# Patient Record
Sex: Female | Born: 1935 | Race: White | Hispanic: No | Marital: Married | Smoking: Never smoker
Health system: Southern US, Community
[De-identification: ages and names within clinical notes are randomized; demographics above are authoritative.]

---

## 2015-06-13 ENCOUNTER — Ambulatory Visit (INDEPENDENT_AMBULATORY_CARE_PROVIDER_SITE_OTHER): Payer: Commercial Managed Care - PPO

## 2015-06-13 ENCOUNTER — Ambulatory Visit (INDEPENDENT_AMBULATORY_CARE_PROVIDER_SITE_OTHER): Payer: Commercial Managed Care - PPO | Admitting: Physician Assistant

## 2015-06-13 VITALS — BP 122/62 | HR 59 | Temp 98.5°F | Ht 65.5 in | Wt 187.0 lb

## 2015-06-13 DIAGNOSIS — M25532 Pain in left wrist: Secondary | ICD-10-CM | POA: Diagnosis not present

## 2015-06-13 DIAGNOSIS — S5292XA Unspecified fracture of left forearm, initial encounter for closed fracture: Secondary | ICD-10-CM

## 2015-06-13 DIAGNOSIS — S6992XA Unspecified injury of left wrist, hand and finger(s), initial encounter: Secondary | ICD-10-CM

## 2015-06-13 MED ORDER — HYDROCODONE-ACETAMINOPHEN 5-325 MG PO TABS: 1.0000 | ORAL_TABLET | Freq: Three times a day (TID) | ORAL | Status: AC | PRN

## 2015-06-13 NOTE — Patient Instructions (Addendum)
IF you received an x-ray today, you will receive an invoice from [facility]. Please contact [facility] at [phone] with questions or concerns regarding your invoice.   IF you received labwork today, you will receive an invoice from [facility]. Please contact [facility] at [phone] with questions or concerns regarding your invoice.   Our billing staff will not be able to assist you with questions regarding bills from these companies.  You will be contacted with the lab results as soon as they are available. The fastest way to get your results is to activate your My Chart account. Instructions are located on the last page of this paperwork. If you have not heard from us regarding the results in 2 weeks, please contact this office.   Please await contact for your appointment with the orthopedist.  I will try to get a doctor in [place].  Please bring your CD to the visit.  You can use the norco for pain.  Keep this splinted.   Radial Fracture A radial fracture is a break in the radius bone, which is the long bone of the forearm that is on the same side as your thumb. Your forearm is the part of your arm that is between your elbow and your wrist. It is made up of two bones: the radius and the ulna. Most radial fractures occur near the wrist (distal radialfracture) or near the elbow (radial head fracture). A distal radial fracture is the most common type of broken arm. This fracture usually occurs about an inch above the wrist. Fractures of the middle part of the bone are less common. CAUSES  Falling with your arm outstretched is the most common cause of a radial fracture. Other causes include:  Car accidents.  Bike accidents.  A direct blow to the middle part of the radius. RISK FACTORS  You may be at greater risk for a distal radial fracture if you are 80 years of age or older.  You may be at greater risk for a radial head fracture if  you are:  Female.  2630-80 years old.  You may be at a greater risk for all types of radial fractures if you have a condition that causes your bones to be weak or thin (osteoporosis). SIGNS AND SYMPTOMS A radial fracture causes pain immediately after the injury. Other signs and symptoms include:  An abnormal bend or bump in your arm (deformity).  Swelling.  Bruising.  Numbness or tingling.  Tenderness.  Limited movement. DIAGNOSIS  Your health care provider may diagnose a radial fracture based on:  Your symptoms.  Your medical history, including any recent injury.  A physical exam. Your health care provider will look for any deformity and feel for tenderness over the break. Your health care provider will also check whether the bone is out of place.  An X-ray exam to confirm the diagnosis and learn more about the type of fracture. TREATMENT The goals of treatment are to get the bone in proper position for healing and to keep it from moving so it will heal over time. Your treatment will depend on many factors, especially the type of fracture that you have.  If the fractured bone:  Is in the correct position (nondisplaced), you may only need to wear a cast or a splint.  Has a slightly displaced fracture, you may need to have the bones moved back into place manually (closed reduction) before the splint or cast is put on.  You may have a temporary splint before you have a plaster cast. The splint allows room for some swelling. After a few days, a cast can replace the splint.  You may have to wear the cast for about 6 weeks or as directed by your health care provider.  The cast may be changed after about 3 weeks or as directed by your health care provider.  After your cast is taken off, you may need physical therapy to regain full movement in your wrist or elbow.  You may need emergency surgery if you have:  A fractured bone that is out of position (displaced).  A fracture  with multiple fragments (comminuted fracture).  A fracture that breaks the skin (open fracture). This type of fracture may require surgical wires, plates, or screws to hold the bone in place.  You may have X-rays every couple of weeks to check on your healing. HOME CARE INSTRUCTIONS  Keep the injured arm above the level of your heart while you are sitting or lying down. This helps to reduce swelling and pain.  Apply ice to the injured area:  Put ice in a plastic bag.  Place a towel between your skin and the bag.  Leave the ice on for 20 minutes, 2-3 times per day.  Move your fingers often to avoid stiffness and to minimize swelling.  If you have a plaster or fiberglass cast:  Do not try to scratch the skin under the cast using sharp or pointed objects.  Check the skin around the cast every day. You may put lotion on any red or sore areas.  Keep your cast dry and clean.  If you have a plaster splint:  Wear the splint as directed.  Loosen the elastic around the splint if your fingers become numb and tingle, or if they turn cold and blue.  Do not put pressure on any part of your cast until it is fully hardened. Rest your cast only on a pillow for the first 24 hours.  Protect your cast or splint while bathing or showering, as directed by your health care provider. Do not put your cast or splint into water.  Take medicines only as directed by your health care provider.  Return to activities, such as sports, as directed by your health care provider. Ask your health care provider what activities are safe for you.  Keep all follow-up visits as directed by your health care provider. This is important. SEEK MEDICAL CARE IF:  Your pain medicine is not helping.  Your cast gets damaged or it breaks.  Your cast becomes loose.  Your cast gets wet.  You have more severe pain or swelling than you did before the cast.  You have severe pain when stretching your fingers.  You  continue to have pain or stiffness in your elbow or your wrist after your cast is taken off. SEEK IMMEDIATE MEDICAL CARE IF:  You cannot move your fingers.  You lose feeling in your fingers or your hand.  Your hand or your fingers turn cold and pale or blue.  You notice a bad smell coming from your cast.  You have drainage from underneath your cast.  You have new stains from blood or drainage seeping through your cast.   This information is not intended to replace advice given to you by your health care provider. Make sure you discuss any questions you have with your health care provider.   Document Released: 08/05/2005 Document Revised: 03/15/2014 Document Reviewed:  08/17/2013 Elsevier Interactive Patient Education [employer]

## 2015-06-18 ENCOUNTER — Other Ambulatory Visit: Payer: Self-pay | Admitting: Physician Assistant

## 2015-06-18 DIAGNOSIS — S5292XA Unspecified fracture of left forearm, initial encounter for closed fracture: Secondary | ICD-10-CM

## 2015-06-18 NOTE — Progress Notes (Signed)
[facility] [address], [place] [id number] [phone]  Date:  06/13/2015   Name:  Ashley PatchJosephine Hayden   DOB:  [birth year]   MRN:  [id number]  PCP:  PROVIDER NOT IN SYSTEM    History of Present Illness:  Ashley Hayden is a 82 y.o. female patient who [facility] wrist pain.  Patient was in bathroom where floor was wet.  She slipped and hit her left hand on the tile on the wall.  Immediate pain and burning through to the digits insued.  It has then began to swell and pain worsening.  Sensation is intact.  She can not move the arm.  She has attempted to keep ice on the wrist.  She can not recall a prior injury to this wrist.       There are no active problems to display for this patient.   No past medical history on file.  No past surgical history on file.  Social History  Substance Use Topics  . Smoking status: Never Smoker   . Smokeless tobacco: Never Used  . Alcohol Use: None    No family history on file.  No Known Allergies  Medication list has been reviewed and updated.  No current outpatient prescriptions on file prior to visit.   No current facility-administered medications on file prior to visit.    ROS ROS otherwise unremarkable unless listed above.   Physical Examination: BP 122/62 mmHg  Pulse 59  Temp(Src) 98.5 F (36.9 C) (Oral)  Ht 5' 5.5" (1.664 m)  Wt 187 lb (84.823 kg)  BMI 30.63 kg/m2  SpO2 96% Ideal Body Weight: Weight in (lb) to have BMI = 25: 152.2  Physical Exam  Constitutional: She is oriented to person, place, and time. She appears well-developed and well-nourished. No distress.  HENT:  Head: Normocephalic and atraumatic.  Right Ear: External ear normal.  Left Ear: External ear normal.  Eyes: Conjunctivae and EOM are normal. Pupils are equal, round, and reactive to light.  Cardiovascular: Normal rate.   Pulmonary/Chest: Effort normal. No respiratory distress.  Musculoskeletal:       Left  wrist: She exhibits decreased range of motion, tenderness (tender along the lateral side of wrist.  pain with radial deviation.  decreased ranage of motion with radial deviation  as well as in all planes.  swelling and ecchymosis along the lateral wrist extending into the thumb.  there is point tenderness at mcp) and swelling.  Normal capillary refill.  Swelling throughout the and to digits.    Neurological: She is alert and oriented to person, place, and time.  Skin: She is not diaphoretic.  Psychiatric: She has a normal mood and affect. Her behavior is normal.   Dg Wrist 2 Views Left  06/13/2015  CLINICAL DATA:  Left wrist pain and swelling. EXAM: LEFT WRIST - 2 VIEW COMPARISON:  None FINDINGS: Diffuse soft tissue swelling present. There may be subtle fracture along the dorsal aspect of the distal radius. Only two views were obtained centered more over the distal forearm. A dedicated 3 view wrist series may be helpful for further delineation. There are degenerative changes involving the radiocarpal joint and first and second CMC joints. IMPRESSION: Soft tissue swelling and possible subtle fracture of the distal radius along its dorsal surface. A dedicated three view wrist series centered more over the wrist may be helpful. Electronically Signed   By: Irish LackGlenn  Yamagata M.D.   On: 06/13/2015 19:07  Dg Hand 2 View Left  06/13/2015  CLINICAL DATA:  Swelling and pain of left wrist.  Injury. EXAM: LEFT HAND - 2 VIEW COMPARISON:  Left wrist films on same date. FINDINGS: As noted on the wrist films, subtle fracture involving the dorsal aspect of the distal radius is suspected. There are diffuse degenerative changes of the hand and carpal bones. Soft tissue swelling is present centered over the wrist. IMPRESSION: Again noted is possible subtle fracture involving the dorsal aspect of the distal radius. Electronically Signed   By: Irish Lack M.D.   On: 06/13/2015 19:11      Assessment and Plan: Ashley Hayden is a 82 y.o. female who is here today with cc of left wrist and hand pain. Distal radial fracture.  This will need to be assessed by ortho.  i have contacted [facility]" who are likely [facility] at [facility] location.  They have graciously agreed to appt. 06/19/2015.  Given medication for pain.  Discussed precautions.  Placed in double sugar tong splint  Radial fracture, left, closed, initial encounter - Plan: HYDROcodone-acetaminophen (NORCO) 5-325 MG tablet  Wrist injury, left, initial encounter - Plan: DG Wrist 2 Views Left, DG Hand 2 View Left, HYDROcodone-acetaminophen (NORCO) 5-325 MG tablet  Wrist pain, acute, left - Plan: DG Wrist 2 Views Left, DG Hand 2 View Left, HYDROcodone-acetaminophen (NORCO) 5-325 MG tablet  Trena Platt, PA-C [facility] 4/12/201712:38 PM     Trena Platt, PA-C [facility] 06/18/2015 12:34 PM

## 2016-11-11 IMAGING — CR DG WRIST 2V*L*
4 series · 4 of 4 positions shown · non-contrast
Comparison: None

CLINICAL DATA: Left wrist pain and swelling.

EXAM:
LEFT WRIST - 2 VIEW

[PA]
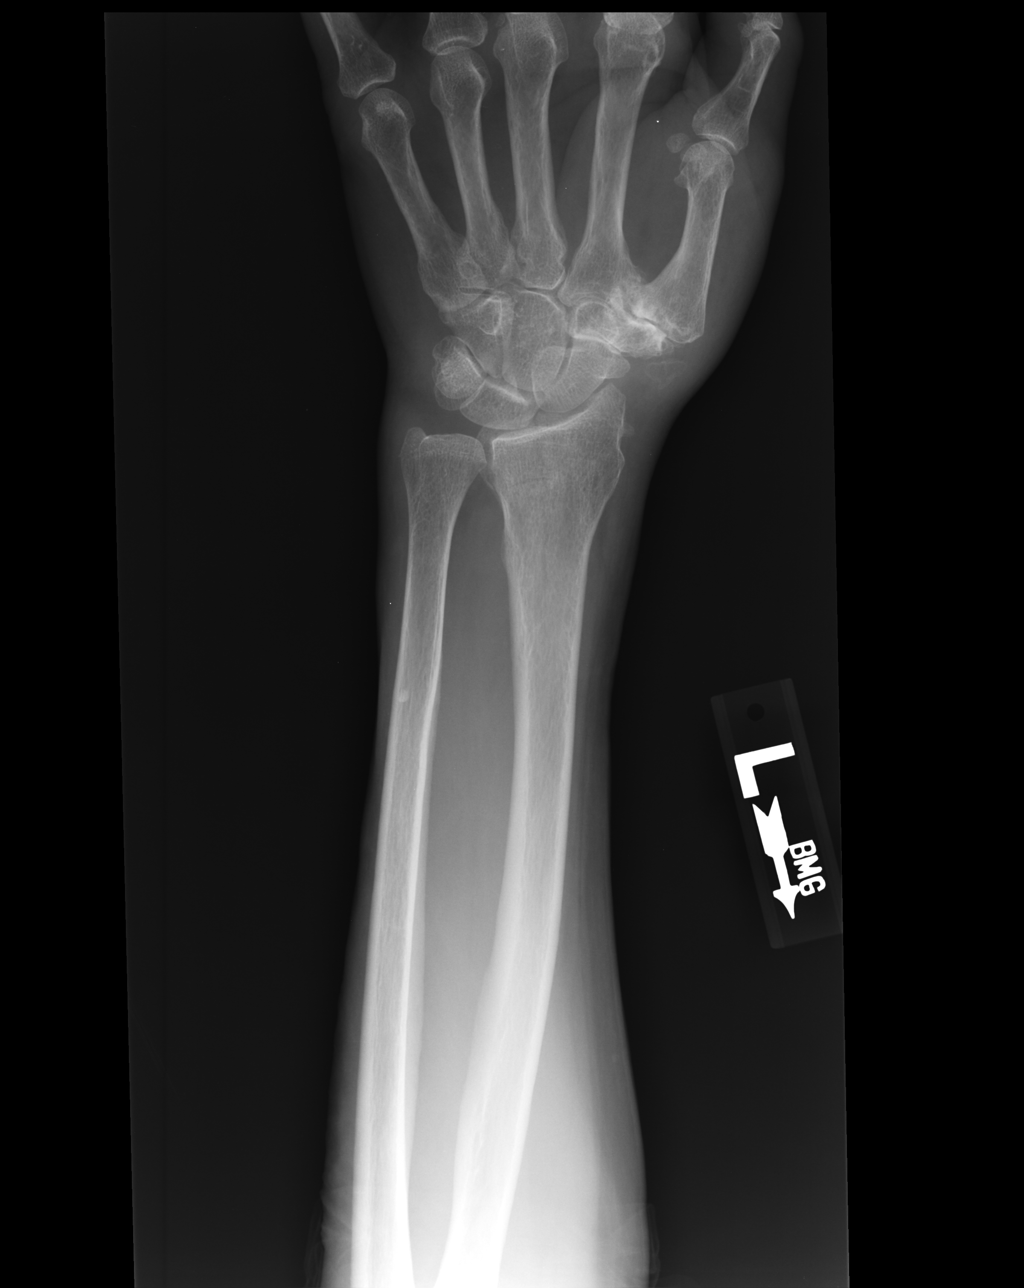

[lateral]
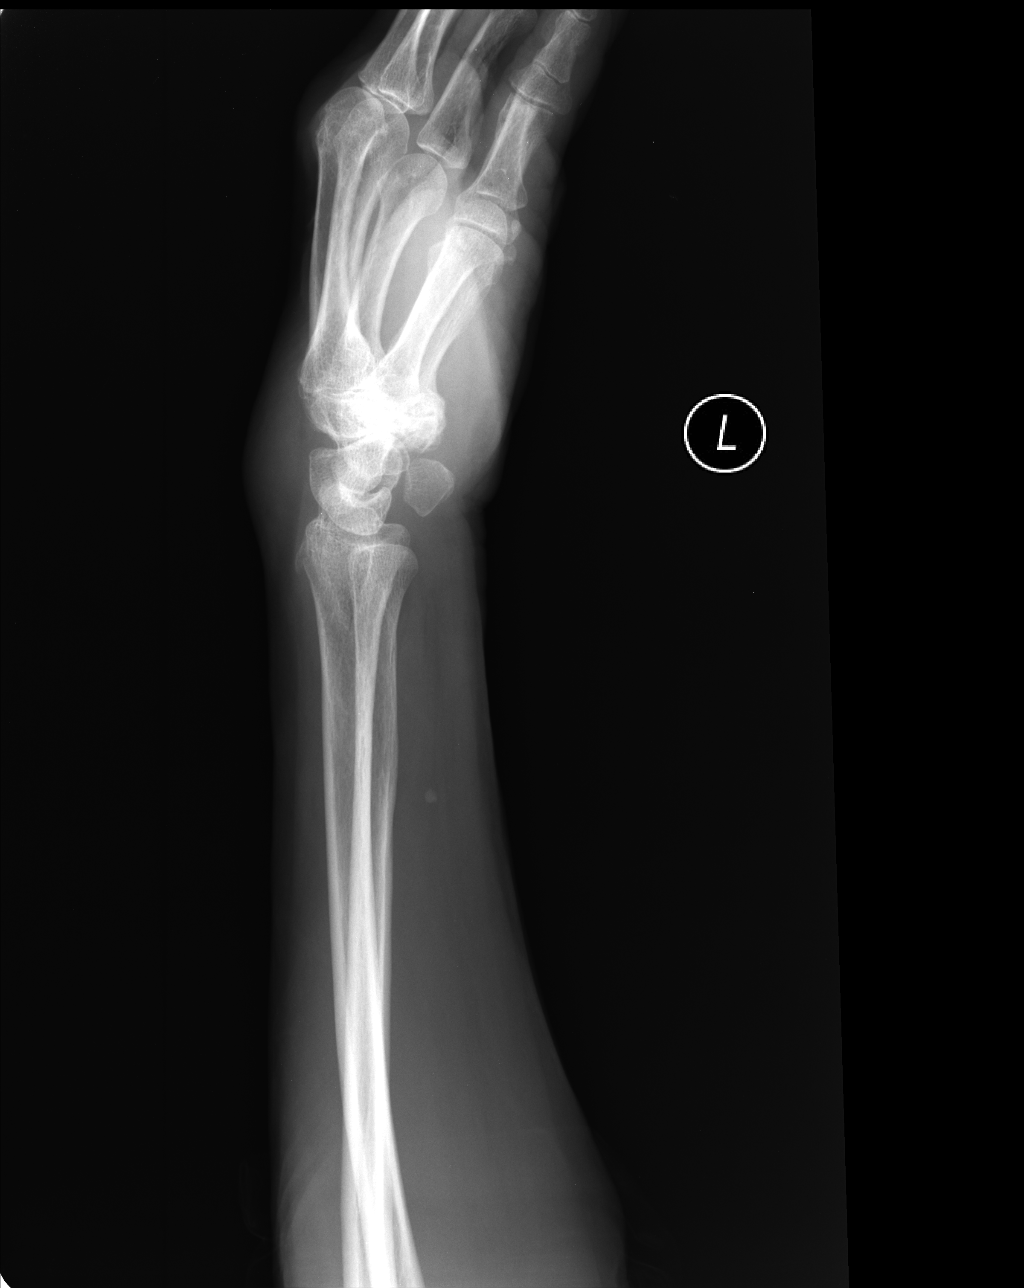

[ap ext rot]
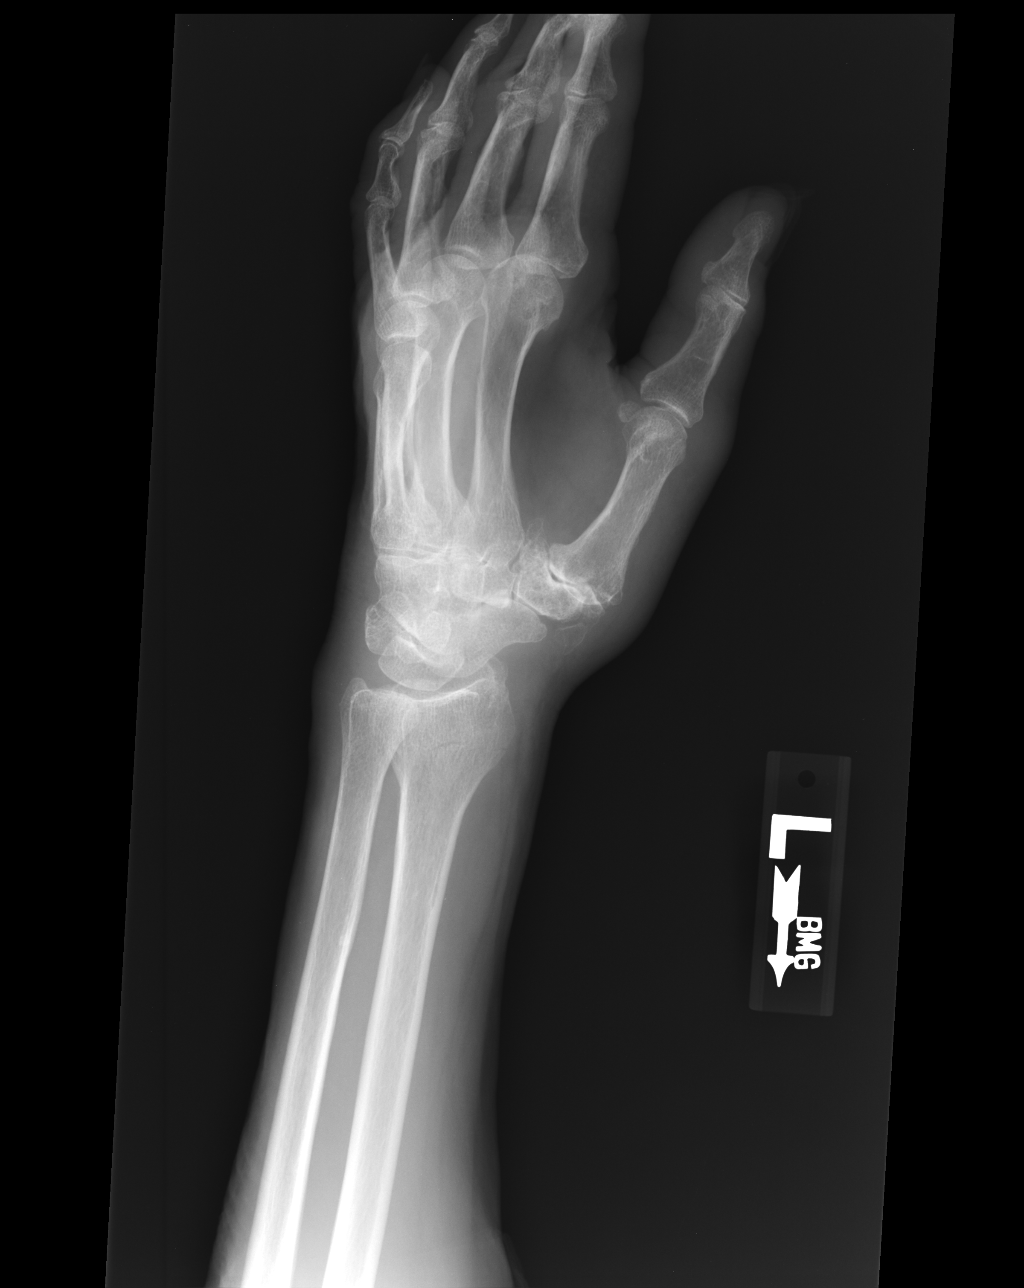

[pa navicular]
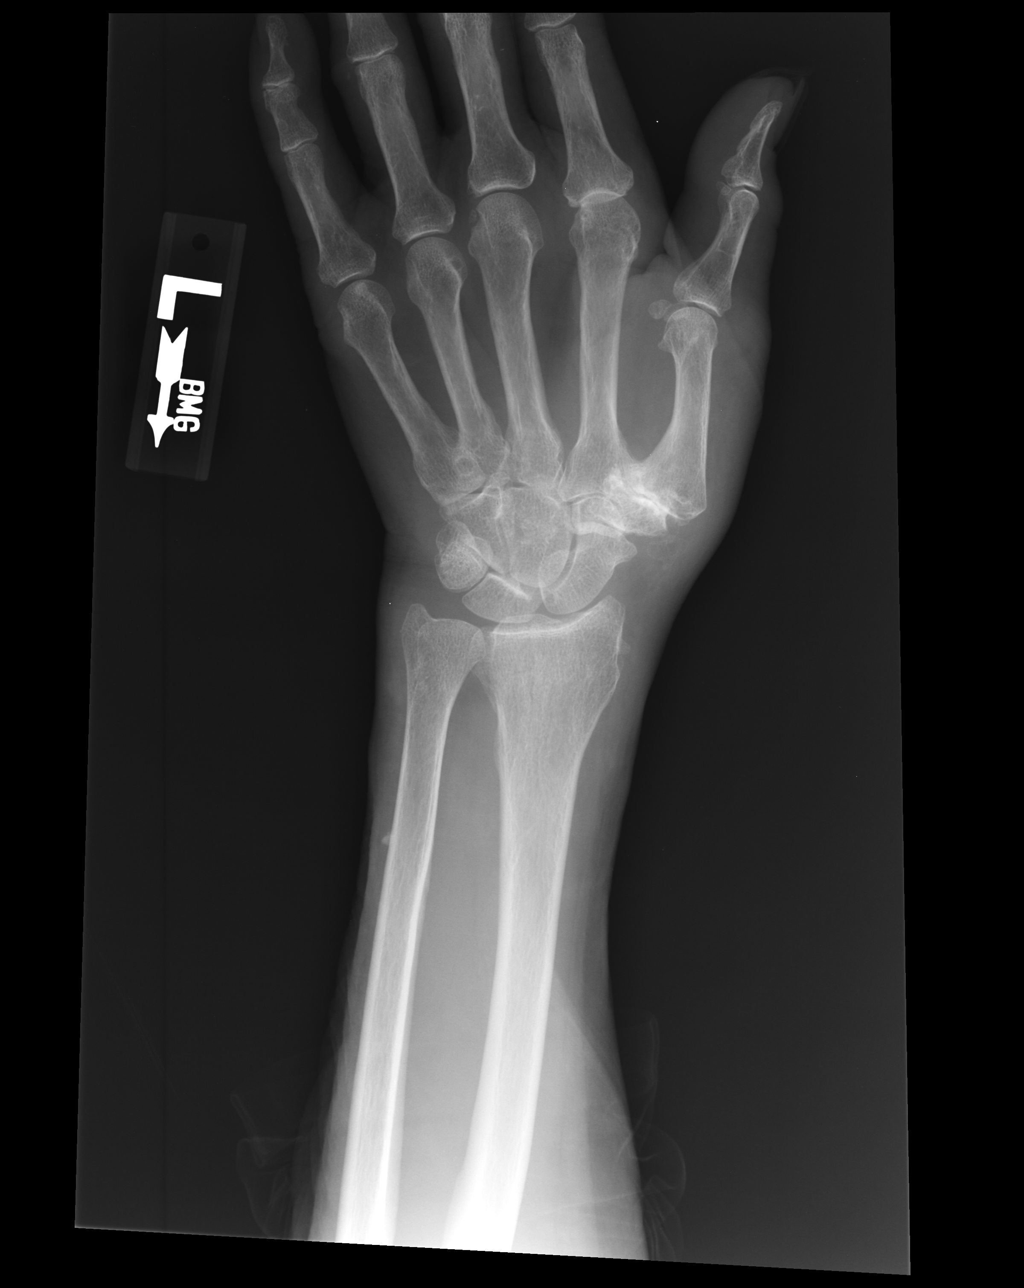

[4 of 4 positions shown; findings below may reference images not displayed]

FINDINGS: Diffuse soft tissue swelling present. There may be subtle fracture
along the dorsal aspect of the distal radius. Only two views were
obtained centered more over the distal forearm. A dedicated 3 view
wrist series may be helpful for further delineation. There are
degenerative changes involving the radiocarpal joint and first and
second CMC joints.
IMPRESSION: Soft tissue swelling and possible subtle fracture of the distal
radius along its dorsal surface. A dedicated three view wrist series
centered more over the wrist may be helpful.
